# Patient Record
Sex: Female | Born: 1985 | Race: Asian | Hispanic: No | Marital: Single | Smoking: Never smoker
Health system: Southern US, Community
[De-identification: ages and names within clinical notes are randomized; demographics above are authoritative.]

## PROBLEM LIST (undated history)

## (undated) HISTORY — PX: MIDDLE EAR SURGERY: SHX713

## (undated) HISTORY — PX: EYE SURGERY: SHX253

---
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
## 2004-05-13 ENCOUNTER — Ambulatory Visit: Payer: Self-pay | Admitting: Family Medicine

## 2006-04-23 ENCOUNTER — Ambulatory Visit: Payer: Self-pay | Admitting: Family Medicine

## 2006-07-23 ENCOUNTER — Ambulatory Visit: Payer: Self-pay | Admitting: Family Medicine

## 2006-08-03 ENCOUNTER — Ambulatory Visit: Payer: Self-pay | Admitting: Family Medicine

## 2006-09-14 ENCOUNTER — Ambulatory Visit: Payer: Self-pay | Admitting: Family Medicine

## 2006-11-17 ENCOUNTER — Ambulatory Visit: Payer: Self-pay | Admitting: Family Medicine

## 2006-11-17 LAB — CONVERTED CEMR LAB
BUN: 8 mg/dL (ref 6–23)
Basophils Absolute: 0 10*3/uL (ref 0.0–0.1)
Basophils Relative: 0 % (ref 0–1)
CO2: 22 meq/L (ref 19–32)
Chloride: 108 meq/L (ref 96–112)
Hemoglobin: 12.9 g/dL (ref 12.0–15.0)
Lymphocytes Relative: 42 % (ref 12–46)
MCHC: 33.2 g/dL (ref 30.0–36.0)
Monocytes Absolute: 0.3 10*3/uL (ref 0.2–0.7)
Monocytes Relative: 8 % (ref 3–11)
Neutro Abs: 1.6 10*3/uL — ABNORMAL LOW (ref 1.7–7.7)
Neutrophils Relative %: 48 % (ref 43–77)
Potassium: 4.4 meq/L (ref 3.5–5.3)
RBC: 4.44 M/uL (ref 3.87–5.11)

## 2007-01-12 ENCOUNTER — Encounter (INDEPENDENT_AMBULATORY_CARE_PROVIDER_SITE_OTHER): Payer: Self-pay | Admitting: *Deleted

## 2010-08-07 ENCOUNTER — Emergency Department (HOSPITAL_COMMUNITY)
Admission: EM | Admit: 2010-08-07 | Discharge: 2010-08-07 | Disposition: A | Discharge status: Home / Self Care | Payer: No Typology Code available for payment source | Attending: Emergency Medicine | Admitting: Emergency Medicine

## 2010-08-07 ENCOUNTER — Emergency Department (HOSPITAL_COMMUNITY): Payer: No Typology Code available for payment source

## 2010-08-07 DIAGNOSIS — IMO0002 Reserved for concepts with insufficient information to code with codable children: Secondary | ICD-10-CM | POA: Insufficient documentation

## 2010-08-07 DIAGNOSIS — S139XXA Sprain of joints and ligaments of unspecified parts of neck, initial encounter: Secondary | ICD-10-CM | POA: Insufficient documentation

## 2010-08-07 DIAGNOSIS — Y92410 Unspecified street and highway as the place of occurrence of the external cause: Secondary | ICD-10-CM | POA: Insufficient documentation
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
## 2014-12-18 ENCOUNTER — Other Ambulatory Visit: Payer: Self-pay | Admitting: Nurse Practitioner

## 2014-12-18 ENCOUNTER — Other Ambulatory Visit (HOSPITAL_COMMUNITY): Payer: Self-pay | Admitting: Nurse Practitioner

## 2014-12-18 ENCOUNTER — Other Ambulatory Visit (HOSPITAL_COMMUNITY)
Admission: RE | Admit: 2014-12-18 | Discharge: 2014-12-18 | Disposition: A | Discharge status: Home / Self Care | Payer: 59 | Source: Ambulatory Visit | Attending: Nurse Practitioner | Admitting: Nurse Practitioner

## 2014-12-18 DIAGNOSIS — Z1151 Encounter for screening for human papillomavirus (HPV): Secondary | ICD-10-CM | POA: Insufficient documentation

## 2014-12-18 DIAGNOSIS — R102 Pelvic and perineal pain unspecified side: Secondary | ICD-10-CM

## 2014-12-18 DIAGNOSIS — Z113 Encounter for screening for infections with a predominantly sexual mode of transmission: Secondary | ICD-10-CM | POA: Insufficient documentation

## 2014-12-18 DIAGNOSIS — Z01419 Encounter for gynecological examination (general) (routine) without abnormal findings: Secondary | ICD-10-CM | POA: Diagnosis present

## 2014-12-19 ENCOUNTER — Ambulatory Visit (HOSPITAL_COMMUNITY): Payer: 59

## 2014-12-24 LAB — CYTOLOGY - PAP

## 2017-12-31 LAB — HM PAP SMEAR: HM Pap smear: NORMAL

## 2018-12-28 ENCOUNTER — Other Ambulatory Visit: Payer: Self-pay

## 2018-12-28 DIAGNOSIS — Z20822 Contact with and (suspected) exposure to covid-19: Secondary | ICD-10-CM

## 2018-12-29 LAB — NOVEL CORONAVIRUS, NAA: SARS-CoV-2, NAA: NOT DETECTED

## 2019-11-10 ENCOUNTER — Ambulatory Visit (INDEPENDENT_AMBULATORY_CARE_PROVIDER_SITE_OTHER): Payer: 59

## 2019-11-10 ENCOUNTER — Ambulatory Visit (INDEPENDENT_AMBULATORY_CARE_PROVIDER_SITE_OTHER): Payer: 59 | Admitting: Orthopaedic Surgery

## 2019-11-10 ENCOUNTER — Ambulatory Visit: Payer: Self-pay

## 2019-11-10 ENCOUNTER — Encounter: Payer: Self-pay | Admitting: Physician Assistant

## 2019-11-10 DIAGNOSIS — M25562 Pain in left knee: Secondary | ICD-10-CM

## 2019-11-10 DIAGNOSIS — G8929 Other chronic pain: Secondary | ICD-10-CM

## 2019-11-10 DIAGNOSIS — M25561 Pain in right knee: Secondary | ICD-10-CM

## 2019-11-10 MED ORDER — METHYLPREDNISOLONE ACETATE 40 MG/ML IJ SUSP
13.3300 mg | INTRAMUSCULAR | Status: AC | PRN
  Administered 2019-11-10: 13.33 mg via INTRA_ARTICULAR

## 2019-11-10 MED ORDER — LIDOCAINE HCL 1 % IJ SOLN
3.0000 mL | INTRAMUSCULAR | Status: AC | PRN
  Administered 2019-11-10: 3 mL

## 2019-11-10 MED ORDER — BUPIVACAINE HCL 0.25 % IJ SOLN
0.6600 mL | INTRAMUSCULAR | Status: AC | PRN
  Administered 2019-11-10: .66 mL via INTRA_ARTICULAR
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
## 2020-09-25 LAB — HM PAP SMEAR: HM Pap smear: NORMAL

## 2021-04-29 ENCOUNTER — Other Ambulatory Visit: Payer: Self-pay

## 2021-04-29 ENCOUNTER — Ambulatory Visit (HOSPITAL_COMMUNITY)
Admission: EM | Admit: 2021-04-29 | Discharge: 2021-04-29 | Disposition: A | Discharge status: Home / Self Care | Payer: 59 | Attending: Urgent Care | Admitting: Urgent Care

## 2021-04-29 ENCOUNTER — Encounter (HOSPITAL_COMMUNITY): Payer: Self-pay | Admitting: Emergency Medicine

## 2021-04-29 DIAGNOSIS — R0789 Other chest pain: Secondary | ICD-10-CM

## 2021-04-29 DIAGNOSIS — R142 Eructation: Secondary | ICD-10-CM

## 2021-04-29 MED ORDER — PANTOPRAZOLE SODIUM 20 MG PO TBEC: 20.0000 mg | DELAYED_RELEASE_TABLET | Freq: Every day | ORAL | 0 refills | Status: DC

## 2021-04-29 NOTE — ED Triage Notes (Signed)
Pt reports will have left sided chest pains that last up to hour that has been intermittent.  Pt having intermittent left side pains that she got seen for year ago but has still been ongoing. Denies n/v, urinary or bowel problems.

## 2021-04-29 NOTE — ED Provider Notes (Signed)
[facility]    CSN: [id number] Arrival date & time: 04/29/21  1158      History   Chief Complaint Chief Complaint  Patient presents with   Chest Pain   Flank Pain    HPI Priscilla Aguirre is a 36 y.o. female.   Pleasant 36 year old female presents today with intermittent chest pains over the past several days.  She has no past medical history and takes no daily prescription medication.  She states since Thursday of last week, she has had intermittent twinges in her chest.  She reports that to be subareolar.  She states she has rubbed the area and cannot reproduce the pain.  She reports that the pain will be sharp in nature and last anywhere from seconds to minutes.  After it resolves spontaneously without any particular treatment, she states it can take several hours to for it to return.  It is not worse with exertion.  She feels like it is not positional.  She denies GERD symptoms or excess flatulence.  She does admit to a mild increase in burping but denies epigastric discomfort. She has not tried any treatments for symptoms.  She does not drink or smoke.  She admits to having increased stress over the past several days after having a large argument with her son.  She states she feels this is stress related, but wanted to rule out a cardiac issue.   Chest Pain  History reviewed. No pertinent past medical history.  There are no problems to display for this patient.   History reviewed. No pertinent surgical history.  OB History   No obstetric history on file.      Home Medications    Prior to Admission medications   Medication Sig Start Date End Date Taking? Authorizing Provider  pantoprazole (PROTONIX) 20 MG tablet Take 1 tablet (20 mg total) by mouth daily for 10 days. 04/29/21 05/09/21 Yes Giordana Weinheimer L, PA    Family History No family history on file.  Social History     Allergies   Patient has no known allergies.   Review of Systems Review of Systems   Cardiovascular:  Positive for chest pain.  As per hpi  Physical Exam Triage Vital Signs ED Triage Vitals [04/29/21 1434]  Enc Vitals Group     BP 130/67     Pulse Rate 89     Resp 17     Temp 98.7 F (37.1 C)     Temp Source Oral     SpO2 100 %     Weight      Height      Head Circumference      Peak Flow      Pain Score 8     Pain Loc      Pain Edu?      Excl. in GC?    No data found.  Updated Vital Signs BP 130/67 (BP Location: Left Arm)    Pulse 89    Temp 98.7 F (37.1 C) (Oral)    Resp 17    LMP 04/21/2021    SpO2 100%   Visual Acuity Right Eye Distance:   Left Eye Distance:   Bilateral Distance:    Right Eye Near:   Left Eye Near:    Bilateral Near:     Physical Exam Vitals and nursing note reviewed.  Constitutional:      General: She is not in acute distress.    Appearance: She is well-developed and normal  weight. She is not ill-appearing, toxic-appearing or diaphoretic.  HENT:     Head: Normocephalic and atraumatic.  Eyes:     Extraocular Movements: Extraocular movements intact.     Pupils: Pupils are equal, round, and reactive to light.  Neck:     Thyroid: No thyromegaly.     Trachea: No tracheal deviation.  Cardiovascular:     Rate and Rhythm: Normal rate and regular rhythm. No extrasystoles are present.    Chest Wall: PMI is not displaced.     Heart sounds: Normal heart sounds. Heart sounds not distant. No murmur heard. No systolic murmur is present.  Pulmonary:     Effort: Pulmonary effort is normal. No tachypnea or respiratory distress.     Breath sounds: Normal breath sounds. No decreased breath sounds, wheezing, rhonchi or rales.  Chest:     Chest wall: No mass, deformity, tenderness or edema.     Comments: Breast exam WNL Abdominal:     Palpations: Abdomen is soft.  Musculoskeletal:        General: Normal range of motion.     Cervical back: Normal range of motion and neck supple.     Right lower leg: No tenderness. No edema.      Left lower leg: No tenderness. No edema.  Lymphadenopathy:     Cervical: No cervical adenopathy.  Skin:    General: Skin is warm.     Findings: No erythema or rash.     Nails: There is no clubbing.  Neurological:     Mental Status: She is alert.     UC Treatments / Results  Labs (all labs ordered are listed, but only abnormal results are displayed) Labs Reviewed - No data to display  EKG   Radiology No results found.  Procedures ED EKG  Date/Time: 04/29/2021 9:56 PM Performed by: Maretta Beesrain, Falen Lehrmann L, PA Authorized by: Maretta Beesrain, Karlye Ihrig L, PA   Interpretation:    Interpretation: normal   Rate:    ECG rate assessment: normal   Rhythm:    Rhythm: sinus rhythm   Ectopy:    Ectopy: none   QRS:    QRS axis:  Normal   QRS intervals:  Normal   QRS conduction: normal   ST segments:    ST segments:  Normal T waves:    T waves: normal   Q waves:    Abnormal Q-waves: not present   (including critical care time)  Medications Ordered in UC Medications - No data to display  Initial Impression / Assessment and Plan / UC Course  I have reviewed the triage vital signs and the nursing notes.  Pertinent labs & imaging results that were available during my care of the patient were reviewed by me and considered in my medical decision making (see chart for details).     Chest pain - likely non-cardiac source. EKG WNL. Vitals stable. Establish care with a PCP for further assessment should symptoms persist, ER for any acute changes. Burping - trial of PPI to see if sx may be related to GI source.  Final Clinical Impressions(s) / UC Diagnoses   Final diagnoses:  Atypical chest pain  Burping     Discharge Instructions      Your EKG is normal. You do not have significant risk factors for CAD Your BP is normal I suspect your chest pain to be non-cardiac. Would like to do a trial of ant-acid given your increased burping. Please follow up with your PCP for additional labs  and  possible cardiology referral should it continue.     ED Prescriptions     Medication Sig Dispense Auth. Provider   pantoprazole (PROTONIX) 20 MG tablet Take 1 tablet (20 mg total) by mouth daily for 10 days. 10 tablet Daniele Yankowski L, [place]      PDMP not reviewed this encounter.   Maretta Bees, [place] 04/29/21 2158

## 2021-04-29 NOTE — Discharge Instructions (Addendum)
Your EKG is normal. You do not have significant risk factors for CAD Your BP is normal I suspect your chest pain to be non-cardiac. Would like to do a trial of ant-acid given your increased burping. Please follow up with your PCP for additional labs and possible cardiology referral should it continue.

## 2021-06-08 NOTE — Progress Notes (Signed)
New Patient Office Visit  Subjective:  Patient ID: Priscilla Aguirre, female    DOB: [birth year]  Age: 36 y.o. MRN: [id number]  CC:  Chief Complaint  Patient presents with   Establish Care    Np. Est care. Pt c/o lower left side pain for several months. Pt also stated she has had chest pains w/ indigestion    HPI [place] presents for presents for new patient visit to establish care.  Introduced to Publishing rights manager role and practice setting.  All questions answered.  Discussed provider/patient relationship and expectations.  She has been endorsing left side pain for several months. The pain comes and goes. She hasn't had any pain in the last month. When she does have pain, it is worse when laying down. The pain does not radiate. She describes it as "Internal" pain and feels like she is being punched. Has not taken anything for it. Denies constipation, diarrhea, blood in urine or stool.   She had an episode of left sided chest pain at the beginning of January. She went to urgent care and had an EKG which was normal. She was given pantoprazole since she also endorsed belching. She has not had any more chest pain in the last week. She denies shortness of breath.   Depression and anxiety done today:   Depression screen [facility] 2/9 06/09/2021  Decreased Interest 1  Down, Depressed, Hopeless 0  PHQ - 2 Score 1  Altered sleeping 1  Tired, decreased energy 0  Change in appetite 0  Feeling bad or failure about yourself  0  Trouble concentrating 1  Moving slowly or fidgety/restless 0  Suicidal thoughts 0  PHQ-9 Score 3  Difficult doing work/chores Somewhat difficult   GAD 7 : Generalized Anxiety Score 06/09/2021  Nervous, Anxious, on Edge 0  Control/stop worrying 1  Worry too much - different things 1  Trouble relaxing 0  Restless 0  Easily annoyed or irritable 0  Afraid - awful might happen 0  Total GAD 7 Score 2  Anxiety Difficulty Somewhat difficult    History reviewed. No pertinent past  medical history.  Past Surgical History:  Procedure Laterality Date   EYE SURGERY     when young   MIDDLE EAR SURGERY Right    when a child    Family History  Problem Relation Age of Onset   Hypertension Mother    Hypertension Father     Social History   Socioeconomic History   Marital status: Single    Spouse name: Not on file   Number of children: Not on file   Years of education: Not on file   Highest education level: Not on file  Occupational History   Not on file  Tobacco Use   Smoking status: Never   Smokeless tobacco: Never  Vaping Use   Vaping Use: Never used  Substance and Sexual Activity   Alcohol use: Never   Drug use: Never   Sexual activity: Not on file  Other Topics Concern   Not on file  Social History Narrative   Not on file   Social Determinants of Health   Financial Resource Strain: Not on file  Food Insecurity: Not on file  Transportation Needs: Not on file  Physical Activity: Not on file  Stress: Not on file  Social Connections: Not on file  Intimate Partner Violence: Not on file    ROS Review of Systems  Constitutional: Negative.   HENT: Negative.    Respiratory: Negative.  Cardiovascular: Negative.   Gastrointestinal:        Left side pain  Genitourinary: Negative.   Musculoskeletal:        Left side pain  Skin: Negative.   Neurological: Negative.   Psychiatric/Behavioral: Negative.     Objective:   Today's Vitals: BP 98/63    Pulse 77    Temp (!) 97 F (36.1 C) (Temporal)    Ht 4\' 9"  (1.448 m)    Wt 97 lb (44 kg)    SpO2 100%    BMI 20.99 kg/m   Physical Exam Vitals and nursing note reviewed.  Constitutional:      General: She is not in acute distress.    Appearance: Normal appearance.  HENT:     Head: Normocephalic and atraumatic.     Right Ear: Tympanic membrane, ear canal and external ear normal.     Left Ear: Tympanic membrane, ear canal and external ear normal.     Nose: Nose normal.     Mouth/Throat:      Mouth: Mucous membranes are moist.     Pharynx: Oropharynx is clear.  Eyes:     Conjunctiva/sclera: Conjunctivae normal.  Cardiovascular:     Rate and Rhythm: Normal rate and regular rhythm.     Pulses: Normal pulses.     Heart sounds: Normal heart sounds.  Pulmonary:     Effort: Pulmonary effort is normal.     Breath sounds: Normal breath sounds.  Abdominal:     General: Bowel sounds are normal.     Palpations: Abdomen is soft.     Tenderness: There is no abdominal tenderness.  Musculoskeletal:        General: Normal range of motion.     Cervical back: Normal range of motion.  Skin:    General: Skin is warm and dry.  Neurological:     General: No focal deficit present.     Mental Status: She is alert and oriented to person, place, and time.     Cranial Nerves: No cranial nerve deficit.     Coordination: Coordination normal.     Gait: Gait normal.  Psychiatric:        Mood and Affect: Mood normal.        Behavior: Behavior normal.        Thought Content: Thought content normal.        Judgment: Judgment normal.    Assessment & Plan:   Problem List Items Addressed This Visit       Digestive   Gastroesophageal reflux disease    She has adjusted her diet and is limiting foods that make her pain worse. The pantoprazole helped with the chest pain and belching. She only takes it as needed. Will continue pantoprazole 20mg  daily prn.         Other   Left lower quadrant abdominal pain - Primary    Endorses LLQ side pain off and on for the past few months. It has not caused pain in the last month. Laying down makes the pain worse. No red flags on exam. Will check U/A, CMP, and CBC. U/A shows 1+blood. If she starts having ongoing pain, will consider CT scan to rule out kidney stones. Discussed drinking plenty of water. Follow up if pain returns or with any concerns.        Relevant Orders   CBC with Differential/Platelet   Comprehensive metabolic panel   POCT urinalysis  dipstick (Completed)    Outpatient Encounter Medications as  of 06/09/2021  Medication Sig   pantoprazole (PROTONIX) 20 MG tablet Take 1 tablet (20 mg total) by mouth daily for 10 days.   No facility-administered encounter medications on file as of 06/09/2021.    Follow-up: Return in about 3 months (around 09/06/2021) for CPE.   Gerre Scull, NP

## 2021-06-09 ENCOUNTER — Other Ambulatory Visit: Payer: Self-pay

## 2021-06-09 ENCOUNTER — Ambulatory Visit (INDEPENDENT_AMBULATORY_CARE_PROVIDER_SITE_OTHER): Payer: 59 | Admitting: Nurse Practitioner

## 2021-06-09 ENCOUNTER — Encounter: Payer: Self-pay | Admitting: Nurse Practitioner

## 2021-06-09 VITALS — BP 98/63 | HR 77 | Temp 97.0°F | Ht <= 58 in | Wt 97.0 lb

## 2021-06-09 DIAGNOSIS — R1032 Left lower quadrant pain: Secondary | ICD-10-CM | POA: Diagnosis not present

## 2021-06-09 DIAGNOSIS — K219 Gastro-esophageal reflux disease without esophagitis: Secondary | ICD-10-CM | POA: Diagnosis not present

## 2021-06-09 LAB — POCT URINALYSIS DIPSTICK
Bilirubin, UA: NEGATIVE
Glucose, UA: NEGATIVE
Ketones, UA: NEGATIVE
Leukocytes, UA: NEGATIVE
Nitrite, UA: NEGATIVE
Protein, UA: NEGATIVE
Spec Grav, UA: 1.025 (ref 1.010–1.025)
Urobilinogen, UA: 0.2 E.U./dL
pH, UA: 6 (ref 5.0–8.0)

## 2021-06-09 NOTE — Assessment & Plan Note (Addendum)
Endorses LLQ side pain off and on for the past few months. It has not caused pain in the last month. Laying down makes the pain worse. No red flags on exam. Will check U/A, CMP, and CBC. U/A shows 1+blood. If she starts having ongoing pain, will consider CT scan to rule out kidney stones. Discussed drinking plenty of water. Follow up if pain returns or with any concerns.

## 2021-06-09 NOTE — Assessment & Plan Note (Signed)
She has adjusted her diet and is limiting foods that make her pain worse. The pantoprazole helped with the chest pain and belching. She only takes it as needed. Will continue pantoprazole 20mg  daily prn.

## 2021-06-09 NOTE — Patient Instructions (Signed)
It was great to see you!  We are checking your blood work and urine sample. We will send the results via mychart/phone call.   Let's follow-up in 3 months, sooner if you have concerns.  If a referral was placed today, you will be contacted for an appointment. Please note that routine referrals can sometimes take up to 3-4 weeks to process. Please call our office if you haven't heard anything after this time frame.  Take care,  Rodman Pickle, NP

## 2021-06-10 LAB — CBC WITH DIFFERENTIAL/PLATELET
Basophils Absolute: 0 10*3/uL (ref 0.0–0.1)
Basophils Relative: 0.8 % (ref 0.0–3.0)
Eosinophils Absolute: 0.1 10*3/uL (ref 0.0–0.7)
Eosinophils Relative: 3 % (ref 0.0–5.0)
HCT: 40.7 % (ref 36.0–46.0)
Hemoglobin: 12.9 g/dL (ref 12.0–15.0)
Lymphocytes Relative: 41.8 % (ref 12.0–46.0)
Lymphs Abs: 2 10*3/uL (ref 0.7–4.0)
MCHC: 31.7 g/dL (ref 30.0–36.0)
MCV: 87.7 fl (ref 78.0–100.0)
Monocytes Absolute: 0.4 10*3/uL (ref 0.1–1.0)
Monocytes Relative: 7.4 % (ref 3.0–12.0)
Neutro Abs: 2.2 10*3/uL (ref 1.4–7.7)
Neutrophils Relative %: 47 % (ref 43.0–77.0)
Platelets: 314 10*3/uL (ref 150.0–400.0)
RBC: 4.64 Mil/uL (ref 3.87–5.11)
RDW: 14.1 % (ref 11.5–15.5)
WBC: 4.7 10*3/uL (ref 4.0–10.5)

## 2021-06-11 LAB — COMPREHENSIVE METABOLIC PANEL
ALT: 18 U/L (ref 0–35)
AST: 25 U/L (ref 0–37)
Albumin: 4.8 g/dL (ref 3.5–5.2)
Alkaline Phosphatase: 44 U/L (ref 39–117)
BUN: 9 mg/dL (ref 6–23)
CO2: 25 mEq/L (ref 19–32)
Calcium: 9.6 mg/dL (ref 8.4–10.5)
Chloride: 102 mEq/L (ref 96–112)
Creatinine, Ser: 0.53 mg/dL (ref 0.40–1.20)
GFR: 118.4 mL/min (ref >60.00)
Glucose, Bld: 63 mg/dL — ABNORMAL LOW (ref 70–99)
Potassium: 3.9 mEq/L (ref 3.5–5.1)
Sodium: 135 mEq/L (ref 135–145)
Total Bilirubin: 0.4 mg/dL (ref 0.2–1.2)
Total Protein: 8.4 g/dL — ABNORMAL HIGH (ref 6.0–8.3)

## 2021-06-12 ENCOUNTER — Telehealth: Payer: Self-pay | Admitting: Nurse Practitioner

## 2021-06-12 NOTE — Telephone Encounter (Signed)
Hi Izora, your urine had a little bit of blood in it, we will recheck this at your next visit. Your blood sugar is a little low at 63. Make sure you are eating meals and snacks regularly. Your kidney, liver, and blood counts are normal.  Pt called about lab results, I let her know of message above and nothing more. She didn't have any questions for the nurse

## 2021-06-12 NOTE — Telephone Encounter (Signed)
Noted  

## 2021-09-05 ENCOUNTER — Ambulatory Visit: Payer: Self-pay | Admitting: Nurse Practitioner

## 2021-09-25 ENCOUNTER — Encounter: Payer: Self-pay | Admitting: Nurse Practitioner

## 2021-09-25 ENCOUNTER — Ambulatory Visit (INDEPENDENT_AMBULATORY_CARE_PROVIDER_SITE_OTHER): Payer: 59 | Admitting: Nurse Practitioner

## 2021-09-25 VITALS — BP 108/66 | HR 113 | Temp 97.8°F | Ht <= 58 in | Wt 96.6 lb

## 2021-09-25 DIAGNOSIS — R3129 Other microscopic hematuria: Secondary | ICD-10-CM | POA: Diagnosis not present

## 2021-09-25 DIAGNOSIS — Z3141 Encounter for fertility testing: Secondary | ICD-10-CM | POA: Diagnosis not present

## 2021-09-25 DIAGNOSIS — R1032 Left lower quadrant pain: Secondary | ICD-10-CM

## 2021-09-25 LAB — POCT URINALYSIS DIPSTICK
Bilirubin, UA: NEGATIVE
Glucose, UA: NEGATIVE
Ketones, UA: NEGATIVE
Leukocytes, UA: NEGATIVE
Nitrite, UA: NEGATIVE
Protein, UA: POSITIVE — AB
Spec Grav, UA: 1.015 (ref 1.010–1.025)
Urobilinogen, UA: 0.2 E.U./dL
pH, UA: 6.5 (ref 5.0–8.0)

## 2021-09-25 NOTE — Patient Instructions (Signed)
It was great to see you!  I have ordered a CT scan of your abdomen and pelvis, they will call to schedule.   Make sure you are drinking plenty of water  Let's follow-up in 4 weeks, sooner if you have concerns.  If a referral was placed today, you will be contacted for an appointment. Please note that routine referrals can sometimes take up to 3-4 weeks to process. Please call our office if you haven't heard anything after this time frame.  Take care,  Rodman Pickle, NP

## 2021-09-25 NOTE — Assessment & Plan Note (Signed)
She is still having ongoing intermittent left side/abdominal pain when she lays down at night.  With blood in her urine, possibly could be kidney stones.  Make sure that she drinks plenty of fluid.  We will check CT of the abdomen and pelvis.  Follow-up in 4 weeks or sooner with concerns.

## 2021-09-25 NOTE — Progress Notes (Signed)
Established Patient Office Visit  Subjective   Patient ID: Priscilla Aguirre, female    DOB: [birth year]  Age: 36 y.o. MRN: [id number]  Chief Complaint  Patient presents with   Follow-up    3 month f/u.  C/o still having pain on the LT side off/on.     HPI  Priscilla Aguirre is here today for follow-up on left lower abdominal pain and microscopic hematuria noted on her U/A.  She is still having intermittent left side pain when laying down at night.  She had pain in the past 2 nights and states that it does not get better until she gets out of bed in the morning.  She denies fevers, nausea, vomiting.  She is also interested to know if there is any way to test on the amount of eggs that she has left as she is interested in getting pregnant in the future.  ROS See pertinent positives and negatives per HPI.    Objective:     BP 108/66   Pulse (!) 113   Temp 97.8 F (36.6 C) (Temporal)   Ht 4\' 9"  (1.448 m)   Wt 96 lb 9.6 oz (43.8 kg)   SpO2 97%   BMI 20.90 kg/m     Physical Exam Vitals and nursing note reviewed.  Constitutional:      General: She is not in acute distress.    Appearance: Normal appearance.  HENT:     Head: Normocephalic.  Eyes:     Conjunctiva/sclera: Conjunctivae normal.  Pulmonary:     Effort: Pulmonary effort is normal.  Abdominal:     General: There is no distension.     Palpations: Abdomen is soft.     Tenderness: There is no abdominal tenderness. There is no right CVA tenderness, left CVA tenderness or guarding. Negative signs include Murphy's sign, Rovsing's sign and McBurney's sign.     Hernia: No hernia is present.  Musculoskeletal:     Cervical back: Normal range of motion.  Skin:    General: Skin is warm.  Neurological:     General: No focal deficit present.     Mental Status: She is alert and oriented to person, place, and time.  Psychiatric:        Mood and Affect: Mood normal.        Behavior: Behavior normal.        Thought Content: Thought content  normal.        Judgment: Judgment normal.     Assessment & Plan:   Problem List Items Addressed This Visit       Other   Left lower quadrant abdominal pain - Primary    She is still having ongoing intermittent left side/abdominal pain when she lays down at night.  With blood in her urine, possibly could be kidney stones.  Make sure that she drinks plenty of fluid.  We will check CT of the abdomen and pelvis.  Follow-up in 4 weeks or sooner with concerns.       Relevant Orders   CT ABDOMEN PELVIS W WO CONTRAST   Other Visit Diagnoses     Microscopic hematuria       Recheck U/A today as microscopic hematuria noted on last sample   Relevant Orders   POCT urinalysis dipstick (Completed)   CT ABDOMEN PELVIS W WO CONTRAST   Fertility testing       She is interested in knowing how many eggs she may have left. Discussed referral to GYN. Will  check [facility], LH, and [facility].    Relevant Orders   [facility]   [facility]   Anti-Mullerian Hormone [facility]), Female       Return in about 4 weeks (around 10/23/2021) for left side pain.    Gerre Scull, NP

## 2021-09-26 LAB — LUTEINIZING HORMONE: LH: 7.52 m[IU]/mL

## 2021-09-26 LAB — FOLLICLE STIMULATING HORMONE: FSH: 6.7 m[IU]/mL

## 2021-09-29 LAB — ANTI-MULLERIAN HORMONE (AMH), FEMALE: Anti-Mullerian Hormones(AMH), Female: 0.56 ng/mL (ref 0.18–5.68)

## 2021-10-16 ENCOUNTER — Telehealth: Payer: Self-pay | Admitting: Nurse Practitioner

## 2021-10-16 NOTE — Telephone Encounter (Signed)
Pt's referral [id number] has not gotten in touch with her yet, she is wondering if there is a hold up on the authorization. [phone]

## 2021-10-27 NOTE — Progress Notes (Deleted)
   Established Patient Office Visit  Subjective   Patient ID: Priscilla Aguirre, female    DOB: 09/26/1985  Age: 36 y.o. MRN: [id number]  No chief complaint on file.   HPI  Priscilla Aguirre is here to follow-up on left side pain.   {History (Optional):23778}  ROS    Objective:     There were no vitals taken for this visit. {Vitals History (Optional):23777}  Physical Exam   No results found for any visits on 10/30/21.  {Labs (Optional):23779}  The ASCVD Risk score (Arnett DK, et al., 2019) failed to calculate for the following reasons:   The 2019 ASCVD risk score is only valid for ages 30 to 84    Assessment & Plan:   Problem List Items Addressed This Visit   None   No follow-ups on file.    Gerre Scull, NP

## 2021-10-30 ENCOUNTER — Ambulatory Visit: Payer: 59 | Admitting: Nurse Practitioner

## 2021-10-30 ENCOUNTER — Telehealth: Payer: Self-pay | Admitting: Nurse Practitioner

## 2021-10-30 NOTE — Telephone Encounter (Signed)
Pt was a no show 10/30/2021 for an OV with Lauren, I have sent out a no show letter

## 2021-11-07 NOTE — Telephone Encounter (Signed)
Noted  

## 2021-11-13 ENCOUNTER — Ambulatory Visit (INDEPENDENT_AMBULATORY_CARE_PROVIDER_SITE_OTHER): Payer: 59 | Admitting: Nurse Practitioner

## 2021-11-13 ENCOUNTER — Encounter: Payer: Self-pay | Admitting: Nurse Practitioner

## 2021-11-13 VITALS — BP 98/85 | HR 87 | Temp 97.7°F | Ht <= 58 in | Wt 95.4 lb

## 2021-11-13 DIAGNOSIS — R1032 Left lower quadrant pain: Secondary | ICD-10-CM

## 2021-11-13 DIAGNOSIS — Z319 Encounter for procreative management, unspecified: Secondary | ICD-10-CM | POA: Diagnosis not present

## 2021-11-13 NOTE — Patient Instructions (Signed)
It was great to see you!  Call our office and make sure we have your new insurance updated. We will run the CT scan through your new insurance and will call to schedule the imaging.    Take care,  Rodman Pickle, NP

## 2021-11-13 NOTE — Progress Notes (Signed)
Established Patient Office Visit  Subjective   Patient ID: Priscilla Aguirre, female    DOB: 09-Aug-1985  Age: 36 y.o. MRN: [id number]  Chief Complaint  Patient presents with   Office Visit    Left sided pain f/u No concerns    HPI  Priscilla Aguirre is here to follow-up on left sided abdominal pain.  She states that it is still ongoing, however is starting to be less frequent.  She states that it occurs when she lays down at night and is on her left side.  She states that she feels like it is deeper inside.  She denies nausea, vomiting, fevers, weight loss, constipation, diarrhea.  Her CT of her abdomen was denied by her insurance.    ROS See pertinent positives and negatives per HPI.    Objective:     BP 98/85 (BP Location: Right Arm, Patient Position: Sitting, Cuff Size: Small)   Pulse 87   Temp 97.7 F (36.5 C) (Temporal)   Ht 4\' 9"  (1.448 m)   Wt 95 lb 6.4 oz (43.3 kg)   SpO2 98%   BMI 20.64 kg/m    Physical Exam Vitals and nursing note reviewed.  Constitutional:      General: She is not in acute distress.    Appearance: Normal appearance.  HENT:     Head: Normocephalic.  Eyes:     Conjunctiva/sclera: Conjunctivae normal.  Cardiovascular:     Rate and Rhythm: Normal rate and regular rhythm.     Pulses: Normal pulses.     Heart sounds: Normal heart sounds.  Pulmonary:     Effort: Pulmonary effort is normal.     Breath sounds: Normal breath sounds.  Abdominal:     General: There is no distension.     Palpations: Abdomen is soft.     Tenderness: There is no abdominal tenderness. There is no right CVA tenderness, left CVA tenderness, guarding or rebound.     Hernia: No hernia is present.  Musculoskeletal:     Cervical back: Normal range of motion.  Skin:    General: Skin is warm.  Neurological:     General: No focal deficit present.     Mental Status: She is alert and oriented to person, place, and time.  Psychiatric:        Mood and Affect: Mood normal.         Behavior: Behavior normal.        Thought Content: Thought content normal.        Judgment: Judgment normal.      Assessment & Plan:   Problem List Items Addressed This Visit       Other   Left lower quadrant abdominal pain - Primary    Chronic, ongoing.  Her symptoms have slightly improved although they are still happening, especially at night when she is laying down.  Encouraged her to start tracking when it is happening, she does sometimes note that it occurs when she is ovulating.  Where she is having her pain, it is not completely consistent with her pelvic area for her ovary.  She does have a referral into GYN.  She states that she is getting new insurance starting August 1.  We will try to run the CT scan through her new insurance since she is still having ongoing pain.      Other Visit Diagnoses     Desire for pregnancy       Labs are drawn last visit which showed  AMH levels to be slightly low.  We will place referral to GYN for her to discuss her desire for pregnancy.   Relevant Orders   Ambulatory referral to Gynecology       Return if symptoms worsen or fail to improve.    Priscilla Scull, NP

## 2021-11-13 NOTE — Assessment & Plan Note (Signed)
Chronic, ongoing.  Her symptoms have slightly improved although they are still happening, especially at night when she is laying down.  Encouraged her to start tracking when it is happening, she does sometimes note that it occurs when she is ovulating.  Where she is having her pain, it is not completely consistent with her pelvic area for her ovary.  She does have a referral into GYN.  She states that she is getting new insurance starting August 1.  We will try to run the CT scan through her new insurance since she is still having ongoing pain.

## 2021-11-24 NOTE — Telephone Encounter (Signed)
Pt came in late for appt - counts for 1st no show, fee waived

## 2022-01-05 ENCOUNTER — Telehealth: Payer: Self-pay | Admitting: Nurse Practitioner

## 2022-01-05 DIAGNOSIS — R109 Unspecified abdominal pain: Secondary | ICD-10-CM

## 2022-01-05 NOTE — Telephone Encounter (Signed)
Caller Name: Laveta Gilkey Call back phone #: [phone]  Reason for Call: Pt was supposed to receive a referral for a CT scan after getting new insurance. I asked for her to upload to MyChart

## 2022-01-06 NOTE — Telephone Encounter (Signed)
Called and explained to pt, once she sends in her new ins information and it is updated in her chart, Lauren will be notified to reorder CT scan. Pt voiced understanding of process and will upload cards via Mychart. Sw, cma

## 2022-01-28 NOTE — Addendum Note (Signed)
Addended by: Vance Peper A on: 01/28/2022 01:40 PM   Modules accepted: Orders

## 2022-01-28 NOTE — Telephone Encounter (Signed)
Caller Name: Samreet Edenfield Call back phone #: [phone]  Reason for Call: Pt has uploaded new insurance, please resend the referral

## 2022-02-24 ENCOUNTER — Other Ambulatory Visit: Payer: Self-pay | Admitting: Nurse Practitioner

## 2022-02-24 ENCOUNTER — Other Ambulatory Visit: Payer: Commercial Managed Care - HMO

## 2022-02-24 ENCOUNTER — Ambulatory Visit
Admission: RE | Admit: 2022-02-24 | Discharge: 2022-02-24 | Disposition: A | Discharge status: Home / Self Care | Payer: Self-pay | Source: Ambulatory Visit | Attending: Nurse Practitioner | Admitting: Nurse Practitioner

## 2022-02-24 DIAGNOSIS — R109 Unspecified abdominal pain: Secondary | ICD-10-CM

## 2022-03-18 ENCOUNTER — Telehealth: Payer: Self-pay

## 2022-03-18 ENCOUNTER — Telehealth: Payer: Self-pay | Admitting: Nurse Practitioner

## 2022-03-18 NOTE — Telephone Encounter (Signed)
Spoke with pt and informed her provider results and instructions pt states that she is still having on the left side of her abdomen that wakes her up at night. Pt states she will start taking miralax.

## 2022-03-18 NOTE — Telephone Encounter (Signed)
Pt is wanting a cb concerning her most recent CT ABDOMEN PELVIS WO CONTRAST (Accession [id number]) (Order [id number]) result. Please advise pt at [phone]

## 2022-03-23 NOTE — Telephone Encounter (Signed)
Spoke with pt with providers instruction pt states pain comes and goes but is feeling better.

## 2022-03-24 NOTE — Telephone Encounter (Signed)
Spoke with pt advise them that their Priscilla Aguirre has been approved.

## 2023-06-18 ENCOUNTER — Ambulatory Visit: Payer: 59 | Admitting: Family Medicine

## 2023-06-18 ENCOUNTER — Ambulatory Visit: Payer: Self-pay | Admitting: Nurse Practitioner

## 2023-06-18 ENCOUNTER — Encounter: Payer: Self-pay | Admitting: Family Medicine

## 2023-06-18 VITALS — BP 106/69 | HR 81 | Temp 98.8°F | Resp 18 | Wt 102.4 lb

## 2023-06-18 DIAGNOSIS — H6501 Acute serous otitis media, right ear: Secondary | ICD-10-CM | POA: Diagnosis not present

## 2023-06-18 MED ORDER — AMOXICILLIN 875 MG PO TABS: 875.0000 mg | ORAL_TABLET | Freq: Two times a day (BID) | ORAL | 0 refills | Status: AC

## 2023-06-18 MED ORDER — NEOMYCIN-POLYMYXIN-HC 3.5-10000-1 OT SUSP: 3.0000 [drp] | Freq: Three times a day (TID) | OTIC | 0 refills | Status: DC

## 2023-06-18 NOTE — Progress Notes (Signed)
Established Patient Office Visit   Subjective:  Patient ID: Priscilla Aguirre, female    DOB: Aug 20, 1985  Age: 38 y.o. MRN: [id number]  Chief Complaint  Patient presents with   Acute Visit    Pt C/O of right ear pain for 3 day with no decrease in hearing     HPI Encounter Diagnoses  Name Primary?   Right acute serous otitis media, recurrence not specified Yes   3-day history of right ear pain.  No change in hearing.  No recent cold or flu symptoms.  History of allergy rhinitis with symptoms predominant present during the summer months.  History of otitis media as a child and then as an adult.  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  Last infection in this ear was 2 or 3 years ago.  Does have a ENT follow-up.   Review of Systems  Constitutional: Negative.   HENT:  Positive for ear pain. Negative for congestion, ear discharge and sore throat.   Eyes:  Negative for blurred vision, discharge and redness.  Respiratory: Negative.    Cardiovascular: Negative.   Gastrointestinal:  Negative for abdominal pain.  Genitourinary: Negative.   Musculoskeletal: Negative.  Negative for myalgias.  Skin:  Negative for rash.  Neurological:  Negative for tingling, loss of consciousness and weakness.  Endo/Heme/Allergies:  Negative for polydipsia.     Current Outpatient Medications:    amoxicillin (AMOXIL) 875 MG tablet, Take 1 tablet (875 mg total) by mouth 2 (two) times daily for 10 days., Disp: 20 tablet, Rfl: 0   neomycin-polymyxin-hydrocortisone (CORTISPORIN) 3.5-10000-1 OTIC suspension, Place 3 drops into the right ear 3 (three) times daily., Disp: 10 mL, Rfl: 0   pantoprazole (PROTONIX) 20 MG tablet, Take 1 tablet (20 mg total) by mouth daily for 10 days. (Patient not taking: Reported on 09/25/2021), Disp: 10 tablet, Rfl: 0   Objective:     BP 106/69 (BP Location: Left Arm, Patient Position: Sitting, Cuff Size: Normal)   Pulse 81   Temp 98.8 F (37.1 C) (Temporal)   Resp 18   Wt 102 lb 6.4  oz (46.4 kg)   LMP 06/01/2023 (Exact Date)   SpO2 100%   BMI 22.16 kg/m    Physical Exam Constitutional:      General: She is not in acute distress.    Appearance: Normal appearance. She is not ill-appearing, toxic-appearing or diaphoretic.  HENT:     Head: Normocephalic and atraumatic.     Right Ear: External ear normal. A middle ear effusion is present. Tympanic membrane is scarred and erythematous.     Left Ear: Tympanic membrane, ear canal and external ear normal.     Ears:     Comments: Loss of light reflex right TM    Mouth/Throat:     Mouth: Mucous membranes are moist.     Pharynx: Oropharynx is clear. No oropharyngeal exudate or posterior oropharyngeal erythema.  Eyes:     General: No scleral icterus.       Right eye: No discharge.        Left eye: No discharge.     Extraocular Movements: Extraocular movements intact.     Conjunctiva/sclera: Conjunctivae normal.     Pupils: Pupils are equal, round, and reactive to light.  Pulmonary:     Effort: Pulmonary effort is normal. No respiratory distress.  Skin:    General: Skin is warm and dry.  Neurological:     Mental Status: She is alert and oriented to person, place,  and time.  Psychiatric:        Mood and Affect: Mood normal.        Behavior: Behavior normal.      No results found for any visits on 06/18/23.    The ASCVD Risk score (Arnett DK, et al., 2019) failed to calculate for the following reasons:   The 2019 ASCVD risk score is only valid for ages 51 to 72    Assessment & Plan:   Right acute serous otitis media, recurrence not specified -     Amoxicillin; Take 1 tablet (875 mg total) by mouth 2 (two) times daily for 10 days.  Dispense: 20 tablet; Refill: 0 -     Neomycin-Polymyxin-HC; Place 3 drops into the right ear 3 (three) times daily.  Dispense: 10 mL; Refill: 0    Return Has scheduled follow-up with ENT next month.Mliss Sax, MD

## 2023-06-18 NOTE — Telephone Encounter (Signed)
  Chief Complaint: R ear pain Symptoms: pain, swelling Frequency: 3 days Pertinent Negatives: Patient denies fever, drainage from ear, URI symptoms Disposition: [] ED /[] Urgent Care (no appt availability in office) / [x] Appointment(In office/virtual)/ []  [facility]/ [] Home Care/ [] Refused Recommended Disposition /[]  Mobile Bus/ []  Follow-up with PCP Additional Notes: Patient calls reporting R ear pain 7/10 x3 days. States at night it is significantly worse, reports hx of chronic ear infections. Has taken tylenol with mild relief. Per protocol, patient to be evaluated within 4 hours. First available appointment with PCP is outside of guideline. Patient scheduled with first available provider in clinic for today at 1100. Care advice reviewed, patient verbalized understanding and denies further questions at this time. Alerting PCP for review.    Copied from CRM [id number]. Topic: Clinical - Red Word Triage >> Jun 18, 2023  8:26 AM Elizebeth Brooking wrote: [facility] that prompted transfer to Nurse Triage: Patient stated she has had very bad ear pain Reason for Disposition  [1] SEVERE pain AND [2] not improved 2 hours after taking analgesic medication (e.g., ibuprofen or acetaminophen)  Answer Assessment - Initial Assessment Questions 1. LOCATION: "Which ear is involved?"     R ear 2. ONSET: "When did the ear start hurting"      3 days ago 3. SEVERITY: "How bad is the pain?"  (Scale 1-10; mild, moderate or severe)   - MILD (1-3): doesn't interfere with normal activities    - MODERATE (4-7): interferes with normal activities or awakens from sleep    - SEVERE (8-10): excruciating pain, unable to do any normal activities      7/10 pain, worse at night 4. URI SYMPTOMS: "Do you have a runny nose or cough?"     Denies 5. FEVER: "Do you have a fever?" If Yes, ask: "What is your temperature, how was it measured, and when did it start?"     Denies 6. CAUSE: "Have you been swimming  recently?", "How often do you use Q-TIPS?", "Have you had any recent air travel or scuba diving?"     Denies 7. OTHER SYMPTOMS: "Do you have any other symptoms?" (e.g., headache, stiff neck, dizziness, vomiting, runny nose, decreased hearing)     In ear swelling 8. PREGNANCY: "Is there any chance you are pregnant?" "When was your last menstrual period?"     Denies  Protocols used: Earache-A-AH

## 2023-07-15 DIAGNOSIS — Z01419 Encounter for gynecological examination (general) (routine) without abnormal findings: Secondary | ICD-10-CM | POA: Diagnosis not present

## 2023-07-15 DIAGNOSIS — Z124 Encounter for screening for malignant neoplasm of cervix: Secondary | ICD-10-CM | POA: Diagnosis not present

## 2023-07-15 DIAGNOSIS — Z3162 Encounter for fertility preservation counseling: Secondary | ICD-10-CM | POA: Diagnosis not present

## 2023-07-16 ENCOUNTER — Ambulatory Visit (INDEPENDENT_AMBULATORY_CARE_PROVIDER_SITE_OTHER): Payer: Commercial Managed Care - HMO | Admitting: Nurse Practitioner

## 2023-07-16 ENCOUNTER — Encounter: Payer: Self-pay | Admitting: Nurse Practitioner

## 2023-07-16 VITALS — BP 100/68 | HR 81 | Temp 97.4°F | Ht <= 58 in | Wt 100.8 lb

## 2023-07-16 DIAGNOSIS — Z136 Encounter for screening for cardiovascular disorders: Secondary | ICD-10-CM

## 2023-07-16 DIAGNOSIS — M79605 Pain in left leg: Secondary | ICD-10-CM | POA: Diagnosis not present

## 2023-07-16 DIAGNOSIS — Z0000 Encounter for general adult medical examination without abnormal findings: Secondary | ICD-10-CM

## 2023-07-16 NOTE — Progress Notes (Signed)
 BP 100/68   Pulse 81   Temp (!) 97.4 F (36.3 C)   Ht 4\' 9"  (1.448 m)   Wt 100 lb 12.8 oz (45.7 kg)   LMP 07/04/2023 (Exact Date)   SpO2 100%   BMI 21.81 kg/m    Subjective:    Patient ID: Priscilla Aguirre, female    DOB: December 06, 1985, 38 y.o.   MRN: [id number]  CC: Chief Complaint  Patient presents with   Annual Exam    Had PAP yesterday at GYN, concerns with left leg pain    HPI: Priscilla Aguirre is a 38 y.o. female presenting on 07/16/2023 for comprehensive medical examination. Current medical complaints include: left leg pain  Discussed the use of AI scribe software for clinical note transcription with the patient, who gave verbal consent to proceed.  History of Present Illness   The patient, who exercises three times a week at the [facility], presents with leg pain that she attributes to inadequate stretching. The pain is located in the calf and is exacerbated by sitting and bending. She denies any associated swelling. The patient has been managing the pain with stretching and heat, particularly in the sauna. She denies taking any over-the-counter pain medications for the discomfort.     Menopausal Symptoms: no  Depression and Anxiety Screen done today and results listed below:     07/16/2023    4:04 PM 07/16/2023    3:49 PM 06/18/2023   11:44 AM 06/18/2023   11:43 AM 06/09/2021    4:01 PM  Depression screen PHQ 2/9  Decreased Interest 0 0 0 0 1  Down, Depressed, Hopeless 0 0 0 0 0  PHQ - 2 Score 0 0 0 0 1  Altered sleeping 0  1  1  Tired, decreased energy 0  0  0  Change in appetite 0  0  0  Feeling bad or failure about yourself  0  0  0  Trouble concentrating 0  0  1  Moving slowly or fidgety/restless 0  0  0  Suicidal thoughts 0  0  0  PHQ-9 Score 0  1  3  Difficult doing work/chores Not difficult at all  Not difficult at all  Somewhat difficult      07/16/2023    4:05 PM 06/18/2023   11:44 AM 06/09/2021    4:01 PM  GAD 7 : Generalized Anxiety Score  Nervous, Anxious, on  Edge 0 0 0  Control/stop worrying 0 0 1  Worry too much - different things 0 0 1  Trouble relaxing 0 0 0  Restless 0 0 0  Easily annoyed or irritable 0 0 0  Afraid - awful might happen 0 0 0  Total GAD 7 Score 0 0 2  Anxiety Difficulty Not difficult at all Not difficult at all Somewhat difficult    The patient does not have a history of falls. I did not complete a risk assessment for falls. A plan of care for falls was not documented.   Past Medical History:  History reviewed. No pertinent past medical history.  Surgical History:  Past Surgical History:  Procedure Laterality Date   EYE SURGERY     when young   MIDDLE EAR SURGERY Right    when a child    Medications:  No current outpatient medications on file prior to visit.   No current facility-administered medications on file prior to visit.    Allergies:  Allergies  Allergen Reactions   Amoxicillin  Swelling    Swelling in face, rash with itching on face and hands    Social History:  Social History   Socioeconomic History   Marital status: Single    Spouse name: Not on file   Number of children: Not on file   Years of education: Not on file   Highest education level: Bachelor's degree (e.g., BA, AB, BS)  Occupational History   Not on file  Tobacco Use   Smoking status: Never   Smokeless tobacco: Never  Vaping Use   Vaping status: Never Used  Substance and Sexual Activity   Alcohol use: Never   Drug use: Never   Sexual activity: Not on file  Other Topics Concern   Not on file  Social History Narrative   Not on file   Social Drivers of Health   Financial Resource Strain: Low Risk  (07/15/2023)   Overall Financial Resource Strain (CARDIA)    Difficulty of Paying Living Expenses: Not very hard  Food Insecurity: Patient Declined (07/15/2023)   Hunger Vital Sign    Worried About Running Out of Food in the Last Year: Patient declined    Ran Out of Food in the Last Year: Patient declined   Transportation Needs: No Transportation Needs (07/15/2023)   PRAPARE - Administrator, Civil Service (Medical): No    Lack of Transportation (Non-Medical): No  Physical Activity: Insufficiently Active (07/15/2023)   Exercise Vital Sign    Days of Exercise per Week: 3 days    Minutes of Exercise per Session: 30 min  Stress: No Stress Concern Present (07/15/2023)   [employer] of Occupational Health - Occupational Stress Questionnaire    Feeling of Stress : Only a little  Social Connections: Moderately Integrated (07/15/2023)   Social Connection and Isolation Panel [NHANES]    Frequency of Communication with Friends and Family: More than three times a week    Frequency of Social Gatherings with Friends and Family: Twice a week    Attends Religious Services: More than 4 times per year    Active Member of [employer] or Organizations: Yes    Attends Banker Meetings: More than 4 times per year    Marital Status: Never married  Intimate Partner Violence: Unknown (07/31/2021)   Received from [employer], [facility]   HITS    Physically Hurt: Not on file    Insult or Talk Down To: Not on file    Threaten Physical Harm: Not on file    Scream or Curse: Not on file   Social History   Tobacco Use  Smoking Status Never  Smokeless Tobacco Never   Social History   Substance and Sexual Activity  Alcohol Use Never    Family History:  Family History  Problem Relation Age of Onset   Hypertension Mother    Hypertension Father     Past medical history, surgical history, medications, allergies, family history and social history reviewed with patient today and changes made to appropriate areas of the chart.   Review of Systems  Constitutional: Negative.   HENT: Negative.    Eyes: Negative.   Respiratory: Negative.    Cardiovascular: Negative.   Gastrointestinal: Negative.   Genitourinary: Negative.   Musculoskeletal:  Positive for myalgias (left leg).   Skin: Negative.   Neurological: Negative.   Psychiatric/Behavioral: Negative.     All other ROS negative except what is listed above and in the HPI.      Objective:  BP 100/68   Pulse 81   Temp (!) 97.4 F (36.3 C)   Ht 4\' 9"  (1.448 m)   Wt 100 lb 12.8 oz (45.7 kg)   LMP 07/04/2023 (Exact Date)   SpO2 100%   BMI 21.81 kg/m   Wt Readings from Last 3 Encounters:  07/16/23 100 lb 12.8 oz (45.7 kg)  06/18/23 102 lb 6.4 oz (46.4 kg)  11/13/21 95 lb 6.4 oz (43.3 kg)    Physical Exam Vitals and nursing note reviewed.  Constitutional:      General: She is not in acute distress.    Appearance: Normal appearance.  HENT:     Head: Normocephalic and atraumatic.     Right Ear: Tympanic membrane, ear canal and external ear normal.     Left Ear: Tympanic membrane, ear canal and external ear normal.     Mouth/Throat:     Mouth: Mucous membranes are moist.     Pharynx: No posterior oropharyngeal erythema.  Eyes:     Conjunctiva/sclera: Conjunctivae normal.  Cardiovascular:     Rate and Rhythm: Normal rate and regular rhythm.     Pulses: Normal pulses.     Heart sounds: Normal heart sounds.  Pulmonary:     Effort: Pulmonary effort is normal.     Breath sounds: Normal breath sounds.  Abdominal:     Palpations: Abdomen is soft.     Tenderness: There is no abdominal tenderness.  Musculoskeletal:        General: Normal range of motion.     Cervical back: Normal range of motion and neck supple.     Right lower leg: No edema.     Left lower leg: No edema.  Lymphadenopathy:     Cervical: No cervical adenopathy.  Skin:    General: Skin is warm and dry.  Neurological:     General: No focal deficit present.     Mental Status: She is alert and oriented to person, place, and time.     Cranial Nerves: No cranial nerve deficit.     Coordination: Coordination normal.     Gait: Gait normal.  Psychiatric:        Mood and Affect: Mood normal.        Behavior: Behavior normal.         Thought Content: Thought content normal.        Judgment: Judgment normal.     Results for orders placed or performed in visit on 09/25/21  HM PAP SMEAR   Collection Time: 09/25/20 12:00 AM  Result Value Ref Range   HM Pap smear normal   [facility]   Collection Time: 09/25/21  4:12 PM  Result Value Ref Range   FSH 6.7 mIU/ML  LH   Collection Time: 09/25/21  4:12 PM  Result Value Ref Range   LH 7.52 mIU/mL  Anti-Mullerian Hormone [facility]), Female   Collection Time: 09/25/21  4:12 PM  Result Value Ref Range   Anti-Mullerian Hormones(AMH), Female 0.56 0.18 - 5.68 ng/mL  POCT urinalysis dipstick   Collection Time: 09/25/21  4:23 PM  Result Value Ref Range   Color, UA yellow    Clarity, UA cloudy    Glucose, UA Negative Negative   Bilirubin, UA negative    Ketones, UA negative    Spec Grav, UA 1.015 1.010 - 1.025   Blood, UA postive    pH, UA 6.5 5.0 - 8.0   Protein, UA Positive (A) Negative   Urobilinogen, UA 0.2 0.2 or  1.0 E.U./dL   Nitrite, UA negative    Leukocytes, UA Negative Negative   Appearance     Odor        Assessment & Plan:   Problem List Items Addressed This Visit       Other   Routine general medical examination at a health care facility - Primary   Health maintenance reviewed and updated. Discussed nutrition, exercise. Check CMP, CBC today. Follow-up 1 year.        Relevant Orders   CBC with Differential/Platelet   Comprehensive metabolic panel   Pain of left lower extremity   Intermittent leg pain is likely due to muscle strain from exercise, occurring with leg bending and relieved by stretching and heat. Use ibuprofen or acetaminophen for pain management. Continue stretching and applying heat.       Other Visit Diagnoses       Screening for cardiovascular condition       Screen lipid panel   Relevant Orders   Lipid panel        Follow up plan: Return in about 1 year (around 07/15/2024) for CPE.   LABORATORY TESTING:  - Pap smear: done  elsewhere  IMMUNIZATIONS:   - Tdap: Tetanus vaccination status reviewed: last tetanus booster within 10 years. - Influenza: Up to date - Pneumovax: Not applicable - Prevnar: Not applicable - HPV: Not applicable - Shingrix vaccine: Not applicable  SCREENING: -Mammogram: Not applicable  - Colonoscopy: Not applicable  - Bone Density: Not applicable   PATIENT COUNSELING:   Advised to take 1 mg of folate supplement per day if capable of pregnancy.   Sexuality: Discussed sexually transmitted diseases, partner selection, use of condoms, avoidance of unintended pregnancy  and contraceptive alternatives.   Advised to avoid cigarette smoking.  I discussed with the patient that most people either abstain from alcohol or drink within safe limits (<=14/week and <=4 drinks/occasion for males, <=7/weeks and <= 3 drinks/occasion for females) and that the risk for alcohol disorders and other health effects rises proportionally with the number of drinks per week and how often a drinker exceeds daily limits.  Discussed cessation/primary prevention of drug use and availability of treatment for abuse.   Diet: Encouraged to adjust caloric intake to maintain  or achieve ideal body weight, to reduce intake of dietary saturated fat and total fat, to limit sodium intake by avoiding high sodium foods and not adding table salt, and to maintain adequate dietary potassium and calcium preferably from fresh fruits, vegetables, and low-fat dairy products.    stressed the importance of regular exercise  Injury prevention: Discussed safety belts, safety helmets, smoke detector, smoking near bedding or upholstery.   Dental health: Discussed importance of regular tooth brushing, flossing, and dental visits.    NEXT PREVENTATIVE PHYSICAL DUE IN 1 YEAR. Return in about 1 year (around 07/15/2024) for CPE.  Ansleigh Safer A Telisha Zawadzki

## 2023-07-16 NOTE — Patient Instructions (Signed)
 It was great to see you!  You can call or schedule a lab visit on your way out.   You can take ibuprofen or tylenol as needed for the leg pain   Let's follow-up in 1 year, sooner if you have concerns.  If a referral was placed today, you will be contacted for an appointment. Please note that routine referrals can sometimes take up to 3-4 weeks to process. Please call our office if you haven't heard anything after this time frame.  Take care,  Rodman Pickle, NP

## 2023-07-16 NOTE — Assessment & Plan Note (Signed)
Health maintenance reviewed and updated. Discussed nutrition, exercise. Check CMP, CBC today. Follow-up 1 year.   

## 2023-07-16 NOTE — Assessment & Plan Note (Signed)
 Intermittent leg pain is likely due to muscle strain from exercise, occurring with leg bending and relieved by stretching and heat. Use ibuprofen or acetaminophen for pain management. Continue stretching and applying heat.

## 2024-07-18 ENCOUNTER — Encounter: Admitting: Nurse Practitioner
